# Patient Record
Sex: Male | Born: 1980 | Race: White | Hispanic: No | Marital: Married | Smoking: Current every day smoker
Health system: Southern US, Community
[De-identification: ages and names within clinical notes are randomized; demographics above are authoritative.]

## PROBLEM LIST (undated history)

## (undated) DIAGNOSIS — S21119A Laceration without foreign body of unspecified front wall of thorax without penetration into thoracic cavity, initial encounter: Secondary | ICD-10-CM

## (undated) DIAGNOSIS — J45909 Unspecified asthma, uncomplicated: Secondary | ICD-10-CM

## (undated) DIAGNOSIS — S42302A Unspecified fracture of shaft of humerus, left arm, initial encounter for closed fracture: Secondary | ICD-10-CM

## (undated) HISTORY — PX: OTHER SURGICAL HISTORY: SHX169

---

## 2015-12-24 ENCOUNTER — Emergency Department (HOSPITAL_COMMUNITY): Payer: No Typology Code available for payment source

## 2015-12-24 ENCOUNTER — Encounter (HOSPITAL_COMMUNITY): Payer: Self-pay | Admitting: *Deleted

## 2015-12-24 ENCOUNTER — Emergency Department (HOSPITAL_COMMUNITY)
Admission: EM | Admit: 2015-12-24 | Discharge: 2015-12-24 | Disposition: A | Discharge status: Home / Self Care | Payer: No Typology Code available for payment source | Attending: Emergency Medicine | Admitting: Emergency Medicine

## 2015-12-24 DIAGNOSIS — Y999 Unspecified external cause status: Secondary | ICD-10-CM | POA: Diagnosis not present

## 2015-12-24 DIAGNOSIS — S6991XA Unspecified injury of right wrist, hand and finger(s), initial encounter: Secondary | ICD-10-CM | POA: Diagnosis present

## 2015-12-24 DIAGNOSIS — Y939 Activity, unspecified: Secondary | ICD-10-CM | POA: Insufficient documentation

## 2015-12-24 DIAGNOSIS — S63501A Unspecified sprain of right wrist, initial encounter: Secondary | ICD-10-CM | POA: Diagnosis not present

## 2015-12-24 DIAGNOSIS — Y92481 Parking lot as the place of occurrence of the external cause: Secondary | ICD-10-CM | POA: Insufficient documentation

## 2015-12-24 DIAGNOSIS — M546 Pain in thoracic spine: Secondary | ICD-10-CM

## 2015-12-24 DIAGNOSIS — M542 Cervicalgia: Secondary | ICD-10-CM | POA: Diagnosis not present

## 2015-12-24 HISTORY — DX: Unspecified asthma, uncomplicated: J45.909

## 2015-12-24 HISTORY — DX: Laceration without foreign body of unspecified front wall of thorax without penetration into thoracic cavity, initial encounter: S21.119A

## 2015-12-24 HISTORY — DX: Unspecified fracture of shaft of humerus, left arm, initial encounter for closed fracture: S42.302A

## 2015-12-24 MED ORDER — METHOCARBAMOL 500 MG PO TABS: 500.0000 mg | ORAL_TABLET | Freq: Two times a day (BID) | ORAL | Status: AC

## 2015-12-24 MED ORDER — TRAMADOL HCL 50 MG PO TABS: 50.0000 mg | ORAL_TABLET | Freq: Four times a day (QID) | ORAL | Status: AC | PRN

## 2015-12-24 MED ORDER — TRAMADOL HCL 50 MG PO TABS
50.0000 mg | ORAL_TABLET | Freq: Once | ORAL | Status: AC
  Administered 2015-12-24: 50 mg via ORAL
  Filled 2015-12-24: qty 1
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
## 2015-12-24 NOTE — Discharge Instructions (Signed)
Back Pain, Adult °Back pain is very common in adults. The cause of back pain is rarely dangerous and the pain often gets better over time. The cause of your back pain may not be known. Some common causes of back pain include: °· Strain of the muscles or ligaments supporting the spine. °· Wear and tear (degeneration) of the spinal disks. °· Arthritis. °· Direct injury to the back. °For many people, back pain may return. Since back pain is rarely dangerous, most people can learn to manage this condition on their own. °HOME CARE INSTRUCTIONS °Watch your back pain for any changes. The following actions may help to lessen any discomfort you are feeling: °· Remain active. It is stressful on your back to sit or stand in one place for long periods of time. Do not sit, drive, or stand in one place for more than 30 minutes at a time. Take short walks on even surfaces as soon as you are able. Try to increase the length of time you walk each day. °· Exercise regularly as directed by your health care provider. Exercise helps your back heal faster. It also helps avoid future injury by keeping your muscles strong and flexible. °· Do not stay in bed. Resting more than 1-2 days can delay your recovery. °· Pay attention to your body when you bend and lift. The most comfortable positions are those that put less stress on your recovering back. Always use proper lifting techniques, including: °¨ Bending your knees. °¨ Keeping the load close to your body. °¨ Avoiding twisting. °· Find a comfortable position to sleep. Use a firm mattress and lie on your side with your knees slightly bent. If you lie on your back, put a pillow under your knees. °· Avoid feeling anxious or stressed. Stress increases muscle tension and can worsen back pain. It is important to recognize when you are anxious or stressed and learn ways to manage it, such as with exercise. °· Take medicines only as directed by your health care provider. Over-the-counter  medicines to reduce pain and inflammation are often the most helpful. Your health care provider may prescribe muscle relaxant drugs. These medicines help dull your pain so you can more quickly return to your normal activities and healthy exercise. °· Apply ice to the injured area: °¨ Put ice in a plastic bag. °¨ Place a towel between your skin and the bag. °¨ Leave the ice on for 20 minutes, 2-3 times a day for the first 2-3 days. After that, ice and heat may be alternated to reduce pain and spasms. °· Maintain a healthy weight. Excess weight puts extra stress on your back and makes it difficult to maintain good posture. °SEEK MEDICAL CARE IF: °· You have pain that is not relieved with rest or medicine. °· You have increasing pain going down into the legs or buttocks. °· You have pain that does not improve in one week. °· You have night pain. °· You lose weight. °· You have a fever or chills. °SEEK IMMEDIATE MEDICAL CARE IF:  °· You develop new bowel or bladder control problems. °· You have unusual weakness or numbness in your arms or legs. °· You develop nausea or vomiting. °· You develop abdominal pain. °· You feel faint. °  °This information is not intended to replace advice given to you by your health care provider. Make sure you discuss any questions you have with your health care provider. °  °Document Released: 07/16/2005 Document Revised: 08/06/2014 Document Reviewed: 11/17/2013 °Elsevier Interactive Patient Education ©2016 Else[employer]   Inc[employer] Wrist Pain There are many things that can cause wrist pain. Some common causes include:  An injury to the wrist area, such as a sprain, strain, or fracture.  Overuse of the joint.  A condition that causes increased pressure on a nerve in the wrist (carpal tunnel syndrome).  Wear and tear of the joints that occurs with aging (osteoarthritis).  A variety of other types of arthritis. Sometimes, the cause of wrist pain is not known. The pain often goes away when you  follow your health care provider's instructions for relieving pain at home. If your wrist pain continues, tests may need to be done to diagnose your condition. HOME CARE INSTRUCTIONS Pay attention to any changes in your symptoms. Take these actions to help with your pain:  Rest the wrist area for at least 48 hours or as told by your health care provider.  If directed, apply ice to the injured area:  Put ice in a plastic bag.  Place a towel between your skin and the bag.  Leave the ice on for 20 minutes, 2-3 times per day.  Keep your arm raised (elevated) above the level of your heart while you are sitting or lying down.  If a splint or elastic bandage has been applied, use it as told by your health care provider.  Remove the splint or bandage only as told by your health care provider.  Loosen the splint or bandage if your fingers become numb or have a tingling feeling, or if they turn cold or blue.  Take over-the-counter and prescription medicines only as told by your health care provider.  Keep all follow-up visits as told by your health care provider. This is important. SEEK MEDICAL CARE IF:  Your pain is not helped by treatment.  Your pain gets worse. SEEK IMMEDIATE MEDICAL CARE IF:  Your fingers become swollen.  Your fingers turn white, very red, or cold and blue.  Your fingers are numb or have a tingling feeling.  You have difficulty moving your fingers.   This information is not intended to replace advice given to you by your health care provider. Make sure you discuss any questions you have with your health care provider.   Document Released: 04/25/2005 Document Revised: 04/06/2015 Document Reviewed: 12/01/2014 Elsevier Interactive Patient Education 2016 [employer] Inc[employer] Cervical Sprain A cervical sprain is an injury in the neck in which the strong, fibrous tissues (ligaments) that connect your neck bones stretch or tear. Cervical sprains can range from mild to  severe. Severe cervical sprains can cause the neck vertebrae to be unstable. This can lead to damage of the spinal cord and can result in serious nervous system problems. The amount of time it takes for a cervical sprain to get better depends on the cause and extent of the injury. Most cervical sprains heal in 1 to 3 weeks. CAUSES  Severe cervical sprains may be caused by:   Contact sport injuries (such as from football, rugby, wrestling, hockey, auto racing, gymnastics, diving, martial arts, or boxing).   Motor vehicle collisions.   Whiplash injuries. This is an injury from a sudden forward and backward whipping movement of the head and neck.  Falls.  Mild cervical sprains may be caused by:   Being in an awkward position, such as while cradling a telephone between your ear and shoulder.   Sitting in a chair that does not offer proper support.   Working at a poorly Marketing executive station.   Looking up or  down for long periods of time.  SYMPTOMS   Pain, soreness, stiffness, or a burning sensation in the front, back, or sides of the neck. This discomfort may develop immediately after the injury or slowly, 24 hours or more after the injury.   Pain or tenderness directly in the middle of the back of the neck.   Shoulder or upper back pain.   Limited ability to move the neck.   Headache.   Dizziness.   Weakness, numbness, or tingling in the hands or arms.   Muscle spasms.   Difficulty swallowing or chewing.   Tenderness and swelling of the neck.  DIAGNOSIS  Most of the time your health care provider can diagnose a cervical sprain by taking your history and doing a physical exam. Your health care provider will ask about previous neck injuries and any known neck problems, such as arthritis in the neck. X-rays may be taken to find out if there are any other problems, such as with the bones of the neck. Other tests, such as a CT scan or MRI, may also be needed.    TREATMENT  Treatment depends on the severity of the cervical sprain. Mild sprains can be treated with rest, keeping the neck in place (immobilization), and pain medicines. Severe cervical sprains are immediately immobilized. Further treatment is done to help with pain, muscle spasms, and other symptoms and may include:  Medicines, such as pain relievers, numbing medicines, or muscle relaxants.   Physical therapy. This may involve stretching exercises, strengthening exercises, and posture training. Exercises and improved posture can help stabilize the neck, strengthen muscles, and help stop symptoms from returning.  HOME CARE INSTRUCTIONS   Put ice on the injured area.   Put ice in a plastic bag.   Place a towel between your skin and the bag.   Leave the ice on for 15-20 minutes, 3-4 times a day.   If your injury was severe, you may have been given a cervical collar to wear. A cervical collar is a two-piece collar designed to keep your neck from moving while it heals.  Do not remove the collar unless instructed by your health care provider.  If you have long hair, keep it outside of the collar.  Ask your health care provider before making any adjustments to your collar. Minor adjustments may be required over time to improve comfort and reduce pressure on your chin or on the back of your head.  Ifyou are allowed to remove the collar for cleaning or bathing, follow your health care provider's instructions on how to do so safely.  Keep your collar clean by wiping it with mild soap and water and drying it completely. If the collar you have been given includes removable pads, remove them every 1-2 days and hand wash them with soap and water. Allow them to air dry. They should be completely dry before you wear them in the collar.  If you are allowed to remove the collar for cleaning and bathing, wash and dry the skin of your neck. Check your skin for irritation or sores. If you see any,  tell your health care provider.  Do not drive while wearing the collar.   Only take over-the-counter or prescription medicines for pain, discomfort, or fever as directed by your health care provider.   Keep all follow-up appointments as directed by your health care provider.   Keep all physical therapy appointments as directed by your health care provider.   Make any needed  adjustments to your workstation to promote good posture.   Avoid positions and activities that make your symptoms worse.   Warm up and stretch before being active to help prevent problems.  SEEK MEDICAL CARE IF:   Your pain is not controlled with medicine.   You are unable to decrease your pain medicine over time as planned.   Your activity level is not improving as expected.  SEEK IMMEDIATE MEDICAL CARE IF:   You develop any bleeding.  You develop stomach upset.  You have signs of an allergic reaction to your medicine.   Your symptoms get worse.   You develop new, unexplained symptoms.   You have numbness, tingling, weakness, or paralysis in any part of your body.  MAKE SURE YOU:   Understand these instructions.  Will watch your condition.  Will get help right away if you are not doing well or get worse.   This information is not intended to replace advice given to you by your health care provider. Make sure you discuss any questions you have with your health care provider.   Document Released: 05/13/2007 Document Revised: 07/21/2013 Document Reviewed: 01/21/2013 Elsevier Interactive Patient Education [employer]2016 [employer] Inc[employer]
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
## 2016-02-16 ENCOUNTER — Encounter (HOSPITAL_COMMUNITY): Payer: Self-pay | Admitting: Emergency Medicine

## 2016-02-16 ENCOUNTER — Emergency Department (HOSPITAL_COMMUNITY)
Admission: EM | Admit: 2016-02-16 | Discharge: 2016-02-16 | Disposition: A | Discharge status: Home / Self Care | Payer: No Typology Code available for payment source | Attending: Emergency Medicine | Admitting: Emergency Medicine

## 2016-02-16 DIAGNOSIS — J45909 Unspecified asthma, uncomplicated: Secondary | ICD-10-CM | POA: Insufficient documentation

## 2016-02-16 DIAGNOSIS — G5601 Carpal tunnel syndrome, right upper limb: Secondary | ICD-10-CM | POA: Insufficient documentation

## 2016-02-16 DIAGNOSIS — F17200 Nicotine dependence, unspecified, uncomplicated: Secondary | ICD-10-CM | POA: Insufficient documentation

## 2016-02-16 MED ORDER — IBUPROFEN 600 MG PO TABS: 600.0000 mg | ORAL_TABLET | Freq: Four times a day (QID) | ORAL | Status: AC | PRN

## 2016-02-16 MED ORDER — IBUPROFEN 800 MG PO TABS
800.0000 mg | ORAL_TABLET | Freq: Once | ORAL | Status: AC
  Administered 2016-02-16: 800 mg via ORAL
  Filled 2016-02-16: qty 1

## 2016-02-16 NOTE — Discharge Instructions (Signed)
Take your medications as prescribed to help with the inflammation and discomfort. Wear your brace when active. Follow-up with Dr. Merlyn Lot next week if symptoms do not improve. Return to ED for new worsening symptoms.  Carpal Tunnel Syndrome Carpal tunnel syndrome is a condition that causes pain in your hand and arm. The carpal tunnel is a narrow area located on the palm side of your wrist. Repeated wrist motion or certain diseases may cause swelling within the tunnel. This swelling pinches the main nerve in the wrist (median nerve). CAUSES  This condition may be caused by:   Repeated wrist motions.  Wrist injuries.  Arthritis.  A cyst or tumor in the carpal tunnel.  Fluid buildup during pregnancy. Sometimes the cause of this condition is not known.  RISK FACTORS This condition is more likely to develop in:   People who have jobs that cause them to repeatedly move their wrists in the same motion, such as butchers and cashiers.  Women.  People with certain conditions, such as:  Diabetes.  Obesity.  An underactive thyroid (hypothyroidism).  Kidney failure. SYMPTOMS  Symptoms of this condition include:   A tingling feeling in your fingers, especially in your thumb, index, and middle fingers.  Tingling or numbness in your hand.  An aching feeling in your entire arm, especially when your wrist and elbow are bent for long periods of time.  Wrist pain that goes up your arm to your shoulder.  Pain that goes down into your palm or fingers.  A weak feeling in your hands. You may have trouble grabbing and holding items. Your symptoms may feel worse during the night.  DIAGNOSIS  This condition is diagnosed with a medical history and physical exam. You may also have tests, including:   An electromyogram (EMG). This test measures electrical signals sent by your nerves into the muscles.  X-rays. TREATMENT  Treatment for this condition includes:  Lifestyle changes. It is  important to stop doing or modify the activity that caused your condition.  Physical or occupational therapy.  Medicines for pain and inflammation. This may include medicine that is injected into your wrist.  A wrist splint.  Surgery. HOME CARE INSTRUCTIONS  If You Have a Splint:  Wear it as told by your health care provider. Remove it only as told by your health care provider.  Loosen the splint if your fingers become numb and tingle, or if they turn cold and blue.  Keep the splint clean and dry. General Instructions  Take over-the-counter and prescription medicines only as told by your health care provider.  Rest your wrist from any activity that may be causing your pain. If your condition is work related, talk to your employer about changes that can be made, such as getting a wrist pad to use while typing.  If directed, apply ice to the painful area:  Put ice in a plastic bag.  Place a towel between your skin and the bag.  Leave the ice on for 20 minutes, 2-3 times per day.  Keep all follow-up visits as told by your health care provider. This is important.  Do any exercises as told by your health care provider, physical therapist, or occupational therapist. SEEK MEDICAL CARE IF:   You have new symptoms.  Your pain is not controlled with medicines.  Your symptoms get worse.   This information is not intended to replace advice given to you by your health care provider. Make sure you discuss any questions you have  with your health care provider.   Document Released: 07/13/2000 Document Revised: 04/06/2015 Document Reviewed: 12/01/2014 Elsevier Interactive Patient Education 2016 [employer]  Peripheral Neuropathy Peripheral neuropathy is a type of nerve damage. It affects nerves that carry signals between the spinal cord and other parts of the body. These are called peripheral nerves. With peripheral neuropathy, one nerve or a group of nerves may be damaged.  CAUSES   Many things can damage peripheral nerves. For some people with peripheral neuropathy, the cause is unknown. Some causes include:  Diabetes. This is the most common cause of peripheral neuropathy.  Injury to a nerve.  Pressure or stress on a nerve that lasts a long time.  Too little vitamin B. Alcoholism can lead to this.  Infections.  Autoimmune diseases, such as multiple sclerosis and systemic lupus erythematosus.  Inherited nerve diseases.  Some medicines, such as cancer drugs.  Toxic substances, such as lead and mercury.  Too little blood flowing to the legs.  Kidney disease.  Thyroid disease. SIGNS AND SYMPTOMS  Different people have different symptoms. The symptoms you have will depend on which of your nerves is damaged. Common symptoms include:  Loss of feeling (numbness) in the feet and hands.  Tingling in the feet and hands.  Pain that burns.  Very sensitive skin.  Weakness.  Not being able to move a part of the body (paralysis).  Muscle twitching.  Clumsiness or poor coordination.  Loss of balance.  Not being able to control your bladder.  Feeling dizzy.  Sexual problems. DIAGNOSIS  Peripheral neuropathy is a symptom, not a disease. Finding the cause of peripheral neuropathy can be hard. To figure that out, your health care provider will take a medical history and do a physical exam. A neurological exam will also be done. This involves checking things affected by your brain, spinal cord, and nerves (nervous system). For example, your health care provider will check your reflexes, how you move, and what you can feel.  Other types of tests may also be ordered, such as:  Blood tests.  A test of the fluid in your spinal cord.  Imaging tests, such as CT scans or an MRI.  Electromyography (EMG). This test checks the nerves that control muscles.  Nerve conduction velocity tests. These tests check how fast messages pass through your nerves.  Nerve  biopsy. A small piece of nerve is removed. It is then checked under a microscope. TREATMENT   Medicine is often used to treat peripheral neuropathy. Medicines may include:  Pain-relieving medicines. Prescription or over-the-counter medicine may be suggested.  Antiseizure medicine. This may be used for pain.  Antidepressants. These also may help ease pain from neuropathy.  Lidocaine. This is a numbing medicine. You might wear a patch or be given a shot.  Mexiletine. This medicine is typically used to help control irregular heart rhythms.  Surgery. Surgery may be needed to relieve pressure on a nerve or to destroy a nerve that is causing pain.  Physical therapy to help movement.  Assistive devices to help movement. HOME CARE INSTRUCTIONS   Only take over-the-counter or prescription medicines as directed by your health care provider. Follow the instructions carefully for any given medicines. Do not take any other medicines without first getting approval from your health care provider.  If you have diabetes, work closely with your health care provider to keep your blood sugar under control.  If you have numbness in your feet:  Check every day for signs of  injury or infection. Watch for redness, warmth, and swelling.  Wear padded socks and comfortable shoes. These help protect your feet.  Do not do things that put pressure on your damaged nerve.  Do not smoke. Smoking keeps blood from getting to damaged nerves.  Avoid or limit alcohol. Too much alcohol can cause a lack of B vitamins. These vitamins are needed for healthy nerves.  Develop a good support system. Coping with peripheral neuropathy can be stressful. Talk to a mental health specialist or join a support group if you are struggling.  Follow up with your health care provider as directed. SEEK MEDICAL CARE IF:   You have new signs or symptoms of peripheral neuropathy.  You are struggling emotionally from dealing with  peripheral neuropathy.  You have a fever. SEEK IMMEDIATE MEDICAL CARE IF:   You have an injury or infection that is not healing.  You feel very dizzy or begin vomiting.  You have chest pain.  You have trouble breathing.   This information is not intended to replace advice given to you by your health care provider. Make sure you discuss any questions you have with your health care provider.   Document Released: 07/06/2002 Document Revised: 03/28/2011 Document Reviewed: 03/23/2013 Elsevier Interactive Patient Education [employer]
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
## 2016-02-16 NOTE — ED Notes (Signed)
NAD at this time. Pt is stable and going home.  

## 2016-02-16 NOTE — ED Notes (Signed)
Pt sts right hand pain with numbness x 4 days; pt sts pain with movement
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
## 2021-11-27 DEATH — deceased
